# Patient Record
Sex: Female | Born: 2000 | Race: White | Hispanic: No | Marital: Single | Smoking: Former smoker
Health system: Southern US, Community
[De-identification: ages and names within clinical notes are randomized; demographics above are authoritative.]

## PROBLEM LIST (undated history)

## (undated) DIAGNOSIS — F909 Attention-deficit hyperactivity disorder, unspecified type: Secondary | ICD-10-CM

## (undated) DIAGNOSIS — F419 Anxiety disorder, unspecified: Secondary | ICD-10-CM

## (undated) DIAGNOSIS — M419 Scoliosis, unspecified: Secondary | ICD-10-CM

## (undated) DIAGNOSIS — F32A Depression, unspecified: Secondary | ICD-10-CM

## (undated) HISTORY — DX: Attention-deficit hyperactivity disorder, unspecified type: F90.9

## (undated) HISTORY — DX: Depression, unspecified: F32.A

## (undated) HISTORY — DX: Scoliosis, unspecified: M41.9

## (undated) HISTORY — PX: NO PAST SURGERIES: SHX2092

## (undated) HISTORY — DX: Anxiety disorder, unspecified: F41.9

---

## 2021-08-29 ENCOUNTER — Encounter: Payer: Self-pay | Admitting: *Deleted

## 2021-08-29 ENCOUNTER — Other Ambulatory Visit: Payer: Self-pay

## 2021-08-29 ENCOUNTER — Emergency Department
Admission: EM | Admit: 2021-08-29 | Discharge: 2021-08-29 | Disposition: A | Discharge status: Home / Self Care | Payer: 59 | Attending: Emergency Medicine | Admitting: Emergency Medicine

## 2021-08-29 DIAGNOSIS — U071 COVID-19: Secondary | ICD-10-CM | POA: Diagnosis not present

## 2021-08-29 DIAGNOSIS — J3489 Other specified disorders of nose and nasal sinuses: Secondary | ICD-10-CM | POA: Diagnosis not present

## 2021-08-29 DIAGNOSIS — F17210 Nicotine dependence, cigarettes, uncomplicated: Secondary | ICD-10-CM | POA: Diagnosis not present

## 2021-08-29 DIAGNOSIS — R112 Nausea with vomiting, unspecified: Secondary | ICD-10-CM | POA: Diagnosis not present

## 2021-08-29 DIAGNOSIS — R110 Nausea: Secondary | ICD-10-CM

## 2021-08-29 DIAGNOSIS — R0981 Nasal congestion: Secondary | ICD-10-CM | POA: Insufficient documentation

## 2021-08-29 MED ORDER — DICYCLOMINE HCL 10 MG PO CAPS: 10.0000 mg | ORAL_CAPSULE | Freq: Three times a day (TID) | ORAL | 0 refills | Status: DC

## 2021-08-29 MED ORDER — ONDANSETRON 4 MG PO TBDP: 4.0000 mg | ORAL_TABLET | Freq: Three times a day (TID) | ORAL | 0 refills | Status: AC | PRN

## 2021-08-29 MED ORDER — ONDANSETRON 4 MG PO TBDP
4.0000 mg | ORAL_TABLET | Freq: Once | ORAL | Status: AC
  Administered 2021-08-29: 4 mg via ORAL
  Filled 2021-08-29: qty 1

## 2021-08-29 NOTE — Discharge Instructions (Addendum)
You can take Zofran up to 3 times daily for the next 5 days. You can take Bentyl for abdominal spasms up to 3 times daily. You can take Zyrtec, and over-the-counter antihistamine once daily for runny nose. You can take 1 spray of Flonase, and over-the-counter medication, for ear pain and nasal congestion.

## 2021-08-29 NOTE — ED Triage Notes (Signed)
Pt tested positive 2 days ago for covid with a home test.  Pt has nausea and vomiting, bodyaches, chills fever .  Pt alert  speech clear.

## 2021-08-29 NOTE — ED Provider Notes (Signed)
[facility]-EMERGENCY DEPARTMENT  ____________________________________________  Time seen: Approximately 6:53 PM  I have reviewed the triage vital signs and the nursing notes.   HISTORY  Chief Complaint Covid Exposure and Nausea   Historian Patient    HPI Cynthia Bush is a 21 y.o. female presents to the emergency department with rhinorrhea, nasal congestion, nonproductive cough, nausea, vomiting and diarrhea.  She tested positive for COVID-19 2 days ago and is requesting antiemetics.  She denies chest pain, chest tightness or abdominal pain.  Patient attends [employer] has numerous potential sick contacts.   No past medical history on file.   Immunizations up to date:  Yes.     No past medical history on file.  There are no problems to display for this patient.     Prior to Admission medications   Medication Sig Start Date End Date Taking? Authorizing Provider  dicyclomine (BENTYL) 10 MG capsule Take 1 capsule (10 mg total) by mouth 4 (four) times daily -  before meals and at bedtime for 3 days. 08/29/21 09/01/21 Yes Pia Mau M, PA-C  escitalopram (LEXAPRO) 10 MG tablet Take 10 mg by mouth daily.   Yes [provider]  ondansetron (ZOFRAN ODT) 4 MG disintegrating tablet Take 1 tablet (4 mg total) by mouth every 8 (eight) hours as needed for up to 5 days. 08/29/21 09/03/21 Yes Orvil Feil, PA-C    Allergies Patient has no known allergies.  No family history on file.  Social History Social History   Tobacco Use   Smoking status: Some Days    Types: Cigarettes   Smokeless tobacco: Never  Vaping Use   Vaping Use: Never used  Substance Use Topics   Alcohol use: Yes     Review of Systems  Constitutional: Patient has fever.  Eyes: No visual changes. No discharge ENT: Patient has congestion.  Cardiovascular: no chest pain. Respiratory: Patient has cough.  Gastrointestinal: No abdominal pain.  No nausea, no vomiting. Patient had diarrhea.   Genitourinary: Negative for dysuria. No hematuria Musculoskeletal: Patient has myalgias.  Skin: Negative for rash, abrasions, lacerations, ecchymosis. Neurological: Patient has headache, no focal weakness or numbness.     ____________________________________________   PHYSICAL EXAM:  VITAL SIGNS: ED Triage Vitals  Enc Vitals Group     BP 08/29/21 1635 132/87     Pulse Rate 08/29/21 1635 77     Resp 08/29/21 1635 18     Temp 08/29/21 1635 99.4 F (37.4 C)     Temp Source 08/29/21 1635 Oral     SpO2 08/29/21 1635 96 %     Weight 08/29/21 1632 145 lb (65.8 kg)     Height 08/29/21 1632 5\' 7"  (1.702 m)     Head Circumference --      Peak Flow --      Pain Score 08/29/21 1632 5     Pain Loc --      Pain Edu? --      Excl. in GC? --     Constitutional: Alert and oriented. Patient is lying supine. Eyes: Conjunctivae are normal. PERRL. EOMI. Head: Atraumatic. ENT:      Ears: Tympanic membranes are mildly injected with mild effusion bilaterally.       Nose: No congestion/rhinnorhea.      Mouth/Throat: Mucous membranes are moist. Posterior pharynx is mildly erythematous.  Hematological/Lymphatic/Immunilogical: No cervical lymphadenopathy.  Cardiovascular: Normal rate, regular rhythm. Normal S1 and S2.  Good peripheral circulation. Respiratory: Normal respiratory effort without tachypnea or retractions. Lungs  CTAB. Good air entry to the bases with no decreased or absent breath sounds. Gastrointestinal: Bowel sounds 4 quadrants. Soft and nontender to palpation. No guarding or rigidity. No palpable masses. No distention. No CVA tenderness. Musculoskeletal: Full range of motion to all extremities. No gross deformities appreciated. Neurologic:  Normal speech and language. No gross focal neurologic deficits are appreciated.  Skin:  Skin is warm, dry and intact. No rash noted. Psychiatric: Mood and affect are normal. Speech and behavior are normal. Patient exhibits appropriate insight  and judgement.'   ____________________________________________   LABS (all labs ordered are listed, but only abnormal results are displayed)  Labs Reviewed - No data to display ____________________________________________  EKG   ____________________________________________  RADIOLOGY   No results found.  ____________________________________________    PROCEDURES  Procedure(s) performed:     Procedures     Medications  ondansetron (ZOFRAN-ODT) disintegrating tablet 4 mg (4 mg Oral Given 08/29/21 1741)     ____________________________________________   INITIAL IMPRESSION / ASSESSMENT AND PLAN / ED COURSE  Pertinent labs & imaging results that were available during my care of the patient were reviewed by me and considered in my medical decision making (see chart for details).      Assessment and Plan:  COVID-[age] year old female presents to the emergency department with viral URI-like symptoms.  Patient was given a prescription for Zofran and Bentyl and advised to follow-up with her primary care provider as needed.      ____________________________________________  FINAL CLINICAL IMPRESSION(S) / ED DIAGNOSES  Final diagnoses:  Nausea      NEW MEDICATIONS STARTED DURING THIS VISIT:  ED Discharge Orders          Ordered    ondansetron (ZOFRAN ODT) 4 MG disintegrating tablet  Every 8 hours PRN        08/29/21 1720    dicyclomine (BENTYL) 10 MG capsule  3 times daily before meals & bedtime        08/29/21 1720                This chart was dictated using voice recognition software/Dragon. Despite best efforts to proofread, errors can occur which can change the meaning. Any change was purely unintentional.     Orvil Feil, PA-C 08/29/21 Heather Roberts, MD 09/01/21 [phone]

## 2022-03-04 ENCOUNTER — Emergency Department
Admission: EM | Admit: 2022-03-04 | Discharge: 2022-03-04 | Disposition: A | Discharge status: Home / Self Care | Payer: 59 | Attending: Emergency Medicine | Admitting: Emergency Medicine

## 2022-03-04 ENCOUNTER — Other Ambulatory Visit: Payer: Self-pay

## 2022-03-04 ENCOUNTER — Emergency Department: Payer: 59

## 2022-03-04 DIAGNOSIS — R109 Unspecified abdominal pain: Secondary | ICD-10-CM | POA: Diagnosis present

## 2022-03-04 DIAGNOSIS — M545 Low back pain, unspecified: Secondary | ICD-10-CM | POA: Insufficient documentation

## 2022-03-04 DIAGNOSIS — R3129 Other microscopic hematuria: Secondary | ICD-10-CM | POA: Insufficient documentation

## 2022-03-04 LAB — URINALYSIS, ROUTINE W REFLEX MICROSCOPIC
Bilirubin Urine: NEGATIVE
Glucose, UA: NEGATIVE mg/dL
Ketones, ur: NEGATIVE mg/dL
Leukocytes,Ua: NEGATIVE
Nitrite: NEGATIVE
Protein, ur: NEGATIVE mg/dL
Specific Gravity, Urine: 1.024 (ref 1.005–1.030)
pH: 5 (ref 5.0–8.0)

## 2022-03-04 LAB — POC URINE PREG, ED: Preg Test, Ur: NEGATIVE

## 2022-03-04 MED ORDER — METHOCARBAMOL 500 MG PO TABS: 500.0000 mg | ORAL_TABLET | Freq: Four times a day (QID) | ORAL | 0 refills | Status: DC

## 2022-03-04 MED ORDER — MELOXICAM 15 MG PO TABS: 15.0000 mg | ORAL_TABLET | Freq: Every day | ORAL | 0 refills | Status: AC

## 2022-03-04 NOTE — ED Provider Notes (Signed)
? ?[facility] ?Provider Note ? ? ? Event Date/Time  ? First MD Initiated Contact with Patient 03/04/22 1548   ?  (approximate) ? ? ?History  ? ?Back Pain ? ? ?HPI ? ?Cynthia Bush is a 22 y.o. female with history of scoliosis and as listed in EMR presents to the emergency department for treatment and evaluation of lower back pain that has been ongoing for about 2 weeks.  This is different from her typical scoliosis type pain.  Pain has gotten worse and its mainly in the left flank.  No dysuria.  No alleviating measures attempted prior to arrival. ? ?  ? ? ?Physical Exam  ? ?Triage Vital Signs: ?ED Triage Vitals  ?Enc Vitals Group  ?   BP 03/04/22 1345 122/77  ?   Pulse Rate 03/04/22 1345 72  ?   Resp 03/04/22 1345 18  ?   Temp 03/04/22 1345 98 ?F (36.7 ?C)  ?   Temp Source 03/04/22 1345 Oral  ?   SpO2 03/04/22 1345 100 %  ?   Weight 03/04/22 1341 145 lb (65.8 kg)  ?   Height 03/04/22 1341 5\' 7"  (1.702 m)  ?   Head Circumference --   ?   Peak Flow --   ?   Pain Score 03/04/22 1341 7  ?   Pain Loc --   ?   Pain Edu? --   ?   Excl. in GC? --   ? ? ?Most recent vital signs: ?Vitals:  ? 03/04/22 1345  ?BP: 122/77  ?Pulse: 72  ?Resp: 18  ?Temp: 98 ?F (36.7 ?C)  ?SpO2: 100%  ? ? ?General: Awake, no distress.  ?CV:  Good peripheral perfusion.  ?Resp:  Normal effort.  ?Abd:  No distention.  ?Other:  No CVA tenderness.  No focal midline tenderness of the lumbar spine. ? ? ?ED Results / Procedures / Treatments  ? ?Labs ?(all labs ordered are listed, but only abnormal results are displayed) ?Labs Reviewed  ?URINALYSIS, ROUTINE W REFLEX MICROSCOPIC - Abnormal; Notable for the following components:  ?    Result Value  ? Color, Urine YELLOW (*)   ? APPearance HAZY (*)   ? Hgb urine dipstick MODERATE (*)   ? Bacteria, UA RARE (*)   ? All other components within normal limits  ?POC URINE PREG, ED  ? ? ? ?EKG ? ? ? ? ?RADIOLOGY ? ?Image and radiology report reviewed by me. ? ?CT for renal stone study was  negative for any acute concerns. ? ?PROCEDURES: ? ?Critical Care performed: No ? ?Procedures ? ? ?MEDICATIONS ORDERED IN ED: ?Medications - No data to display ? ? ?IMPRESSION / MDM / ASSESSMENT AND PLAN / ED COURSE  ? ?I have reviewed the triage note. ? ?Differential diagnosis includes, but is not limited to, acute on chronic back pain, nephrolithiasis, ureterolithiasis, pyelonephritis, acute cystitis ? ?22 year old female presenting to the emergency department for treatment and evaluation of left flank pain.  See HPI for further details.  Urinalysis does indicate that there is some microscopic hematuria.  CT renal stone study obtained and is negative for kidney stone.  Urinalysis does not indicate cystitis.  Symptoms most likely musculoskeletal and she will be treated with Robaxin and meloxicam.  She is to follow-up with primary care or return to the emergency department if symptoms change or worsen. ? ?  ? ? ?FINAL CLINICAL IMPRESSION(S) / ED DIAGNOSES  ? ?Final diagnoses:  ?Acute flank pain  ? ? ? ?  Rx / DC Orders  ? ?ED Discharge Orders   ? ?      Ordered  ?  methocarbamol (ROBAXIN) 500 MG tablet  4 times daily       ? 03/04/22 1654  ?  meloxicam (MOBIC) 15 MG tablet  Daily       ? 03/04/22 1654  ? ?  ?  ? ?  ? ? ? ?Note:  This document was prepared using Dragon voice recognition software and may include unintentional dictation errors. ?  Chinita Pester, FNP ?03/05/22 1549 ? ?  ?Sharyn Creamer, MD ?03/12/22 2351 ? ?

## 2022-03-04 NOTE — ED Notes (Signed)
See triage note  presents with lower back pain  states pain started couple of days ago  denies any recent injury  ambulates well  no urinary sxs' ?

## 2022-03-04 NOTE — ED Triage Notes (Signed)
Pt comes pov with lower back pain for about 2 weeks. States she has scoliolis and sometimes it flairs up.  ?

## 2022-03-21 ENCOUNTER — Ambulatory Visit (INDEPENDENT_AMBULATORY_CARE_PROVIDER_SITE_OTHER): Payer: 59 | Admitting: Obstetrics and Gynecology

## 2022-03-21 ENCOUNTER — Encounter: Payer: Self-pay | Admitting: Obstetrics and Gynecology

## 2022-03-21 ENCOUNTER — Other Ambulatory Visit (HOSPITAL_COMMUNITY)
Admission: RE | Admit: 2022-03-21 | Discharge: 2022-03-21 | Disposition: A | Discharge status: Home / Self Care | Payer: 59 | Source: Ambulatory Visit | Attending: Obstetrics and Gynecology | Admitting: Obstetrics and Gynecology

## 2022-03-21 ENCOUNTER — Other Ambulatory Visit: Payer: Self-pay

## 2022-03-21 VITALS — BP 110/70 | Ht 67.0 in | Wt 145.0 lb

## 2022-03-21 DIAGNOSIS — M545 Low back pain, unspecified: Secondary | ICD-10-CM

## 2022-03-21 DIAGNOSIS — M4150 Other secondary scoliosis, site unspecified: Secondary | ICD-10-CM

## 2022-03-21 DIAGNOSIS — Z113 Encounter for screening for infections with a predominantly sexual mode of transmission: Secondary | ICD-10-CM | POA: Insufficient documentation

## 2022-03-21 DIAGNOSIS — M419 Scoliosis, unspecified: Secondary | ICD-10-CM | POA: Insufficient documentation

## 2022-03-21 DIAGNOSIS — Z124 Encounter for screening for malignant neoplasm of cervix: Secondary | ICD-10-CM | POA: Insufficient documentation

## 2022-03-21 DIAGNOSIS — N946 Dysmenorrhea, unspecified: Secondary | ICD-10-CM

## 2022-03-21 DIAGNOSIS — Z3041 Encounter for surveillance of contraceptive pills: Secondary | ICD-10-CM | POA: Diagnosis not present

## 2022-03-21 DIAGNOSIS — G8929 Other chronic pain: Secondary | ICD-10-CM

## 2022-03-21 MED ORDER — NORGESTIMATE-ETH ESTRADIOL 0.25-35 MG-MCG PO TABS: 1.0000 | ORAL_TABLET | Freq: Every day | ORAL | 4 refills | Status: AC

## 2022-03-21 NOTE — Patient Instructions (Signed)
I value your feedback and you entrusting us with your care. If you get a [facility] patient survey, I would appreciate you taking the time to let us know about your experience today. Thank you! ? ? ?

## 2022-03-21 NOTE — Progress Notes (Signed)
? ? ?[facility], [facility] ? ? ?Chief Complaint  ?Patient presents with  ? Dysmenorrhea  ?  X 2-3 years  ? Pap only  ? ? ?HPI: ?     [name] is a 22 y.o. No obstetric history on file. whose LMP was Patient's last menstrual period was 03/13/2022 (approximate)., presents today for NP eval of dysmenorrhea, referred by PCP. Menses are monthly on OCPs, lasting 6 days, mod flow, no BTB. Pt with severe pelvic pain and low back pain with menses for a few yrs, not improved with NSAIDs/heating pad. Sometimes misses school/activities due to pain. Hx of scoliosis and LBP in general, has been getting worse recently. Pain with menses worse since worsening scoliosis pain. Now doing PT/stretching with some relief. Went to ED 3/23 for LT flank pain. No relief with meloxicam/flexeril. Ice helps the most. Had neg CT renal study with neg uterus/ovaries.  ?Pap smear due, no hx of STDs. Pt is sex active, using pills/condoms sometimes. No pain/bleeding with sex.  ?Gardasil completed.  ? ? ?Past Medical History:  ?Diagnosis Date  ? ADHD   ? Anxiety   ? Depression   ? Scoliosis   ? ? ?Past Surgical History:  ?Procedure Laterality Date  ? NO PAST SURGERIES    ? ? ?Family History  ?Problem Relation Age of Onset  ? Breast cancer Maternal Grandmother 6  ? Cancer - Colon Maternal Grandfather 16  ? ? ?Social History  ? ?Socioeconomic History  ? Marital status: Single  ?  Spouse name: Not on file  ? Number of children: Not on file  ? Years of education: Not on file  ? Highest education level: Not on file  ?Occupational History  ? Not on file  ?Tobacco Use  ? Smoking status: Former  ?  Types: Cigarettes  ? Smokeless tobacco: Never  ?Vaping Use  ? Vaping Use: Never used  ?Substance and Sexual Activity  ? Alcohol use: Yes  ? Drug use: Yes  ?  Types: Marijuana  ? Sexual activity: Yes  ?  Birth control/protection: Pill, Condom  ?Other Topics Concern  ? Not on file  ?Social History Narrative  ? Not on file  ? ?Social Determinants of Health   ? ?Financial Resource Strain: Not on file  ?Food Insecurity: Not on file  ?Transportation Needs: Not on file  ?Physical Activity: Not on file  ?Stress: Not on file  ?Social Connections: Not on file  ?Intimate Partner Violence: Not on file  ? ? ?Outpatient Medications Prior to Visit  ?Medication Sig Dispense Refill  ? cloNIDine (CATAPRES) 0.1 MG tablet Take 0.1-0.2 mg by mouth at bedtime as needed.    ? escitalopram (LEXAPRO) 20 MG tablet Take 20 mg by mouth daily.    ? meloxicam (MOBIC) 15 MG tablet Take 1 tablet (15 mg total) by mouth daily. 30 tablet 0  ? venlafaxine XR (EFFEXOR-XR) 37.5 MG 24 hr capsule Take by mouth.    ? VYVANSE 50 MG capsule Take 50 mg by mouth every morning.    ? ESTARYLLA 0.25-35 MG-MCG tablet Take 1 tablet by mouth daily.    ? dicyclomine (BENTYL) 10 MG capsule Take 1 capsule (10 mg total) by mouth 4 (four) times daily -  before meals and at bedtime for 3 days. 10 capsule 0  ? escitalopram (LEXAPRO) 10 MG tablet Take 10 mg by mouth daily.    ? methocarbamol (ROBAXIN) 500 MG tablet Take 1 tablet (500 mg total) by mouth 4 (four)  times daily. 30 tablet 0  ? ?No facility-administered medications prior to visit.  ? ? ? ? ?ROS: ? ?Review of Systems  ?Constitutional:  Negative for fever.  ?Gastrointestinal:  Negative for blood in stool, constipation, diarrhea, nausea and vomiting.  ?Genitourinary:  Positive for pelvic pain. Negative for dyspareunia, dysuria, flank pain, frequency, hematuria, urgency, vaginal bleeding, vaginal discharge and vaginal pain.  ?Musculoskeletal:  Positive for back pain.  ?Skin:  Negative for rash.  ?BREAST: No symptoms ? ? ?OBJECTIVE:  ? ?Vitals:  ?BP 110/70   Ht 5\' 7"  (1.702 m)   Wt 145 lb (65.8 kg)   LMP 03/13/2022 (Approximate)   BMI 22.71 kg/m?  ? ?Physical Exam ?Vitals reviewed.  ?Constitutional:   ?   Appearance: She is well-developed.  ?Pulmonary:  ?   Effort: Pulmonary effort is normal.  ?Abdominal:  ?   Palpations: Abdomen is soft.  ?   Tenderness: There is  abdominal tenderness in the suprapubic area. There is no guarding or rebound.  ?Genitourinary: ?   General: Normal vulva.  ?   Pubic Area: No rash.   ?   Labia:     ?   Right: No rash, tenderness or lesion.     ?   Left: No rash, tenderness or lesion.   ?   Vagina: Normal. No vaginal discharge, erythema or tenderness.  ?   Cervix: Normal.  ?   Uterus: Normal. Not enlarged and not tender.   ?   Adnexa: Right adnexa normal and left adnexa normal.    ?   Right: No mass or tenderness.      ?   Left: No mass or tenderness.    ?Musculoskeletal:     ?   General: Normal range of motion.  ?   Cervical back: Normal range of motion.  ?Skin: ?   General: Skin is warm and dry.  ?Neurological:  ?   General: No focal deficit present.  ?   Mental Status: She is alert and oriented to person, place, and time.  ?Psychiatric:     ?   Mood and Affect: Mood normal.     ?   Behavior: Behavior normal.     ?   Thought Content: Thought content normal.     ?   Judgment: Judgment normal.  ? ? ? ?Assessment/Plan: ?Dysmenorrhea - Plan: norgestimate-ethinyl estradiol (ESTARYLLA) 0.25-35 MG-MCG tablet; sx more than likely related to exacerbated scoliosis pain with menses. Pt with neg CT 2/23 so GYN u/s not indicated at this time. Discussed dx lap for endometriosis if sx persist. Discussed trying to prevent cycles for now with cont dosing OCPs vs depo. Will do cont dosing OCPs, Rx eRxd. Pt to f/u with pain. If sx persist, will try depo or dx lap ref. Pt graduating 5/23 from [place] and moving away. ? ?Chronic bilateral low back pain without sciatica--cont exercise/PT. Can schedule appt at [facility] for further eval.  ? ?Other secondary scoliosis, unspecified spinal region ? ?Encounter for surveillance of contraceptive pills - Plan: norgestimate-ethinyl estradiol (ESTARYLLA) 0.25-35 MG-MCG tablet; Rx RF ? ?Cervical cancer screening - Plan: Cytology - PAP ? ?Screening for STD (sexually transmitted disease) - Plan: Cytology - PAP ? ? ? ?Meds ordered  this encounter  ?Medications  ? norgestimate-ethinyl estradiol (ESTARYLLA) 0.25-35 MG-MCG tablet  ?  Sig: Take 1 tablet by mouth daily. CONTINUOUS DOSING  ?  Dispense:  84 tablet  ?  Refill:  4  ?  Order Specific Question:  Supervising Provider  ?  AnswerNadara Mustard:   HARRIS, ROBERT P [[id number]]  ? ? ? ? Return if symptoms worsen or fail to improve. ? ?Tryone Kille B. Shunte Senseney, PA-C ?03/21/2022 ?2:31 PM ? ? ? ? ? ?

## 2022-03-23 LAB — CYTOLOGY - PAP
Chlamydia: NEGATIVE
Comment: NEGATIVE
Comment: NORMAL
Diagnosis: NEGATIVE
Neisseria Gonorrhea: NEGATIVE

## 2022-04-03 ENCOUNTER — Telehealth: Payer: Self-pay

## 2022-04-03 NOTE — Telephone Encounter (Signed)
Pt calling; painful periods; scoliosis back pain; is taking bcp continuous; is bleeding c clots; has freq urination, and vaginal swelling, is curious about endometriosis.  What are next steps.  [phone] Called pt to see if saturating a pad q24min-1hr; pt states she probably is; adv per policy to go to ER; is allowed someone to be with her. ?

## 2022-05-29 ENCOUNTER — Other Ambulatory Visit: Payer: Self-pay

## 2022-05-30 LAB — LITHIUM LEVEL: Lithium Lvl: 0.8 mmol/L (ref 0.5–1.2)

## 2022-05-30 LAB — VITAMIN B12: Vitamin B-12: 187 pg/mL — ABNORMAL LOW (ref 232–1245)

## 2023-01-24 IMAGING — CT CT RENAL STONE PROTOCOL
3 of 4 series · 8 of 46 positions shown, 15 images · non-contrast
Comparison: None.

CLINICAL DATA: Flank pain, kidney stone suspected



[Series 4: lung bases · axial · 0.79mm/px · z∈[-685,-625]mm · 4 of 22 slices shown, 9 images]
[im 5/22  soft-tissue]
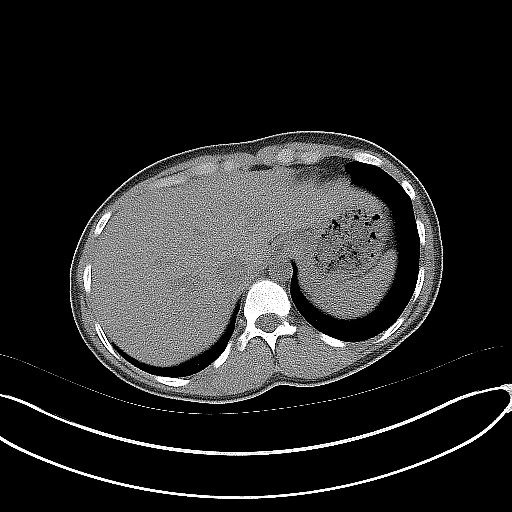
[im 5/22  lung]
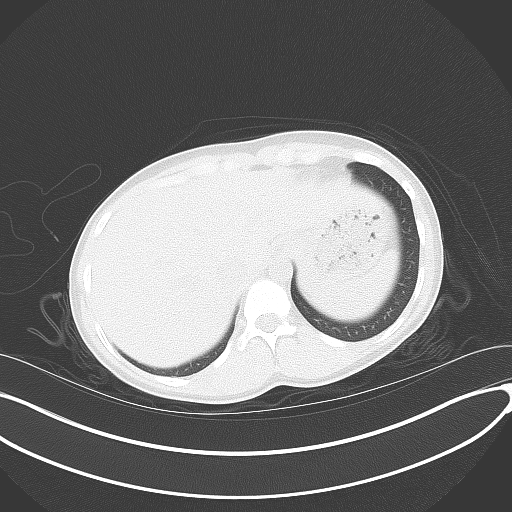
[im 5/22  bone]
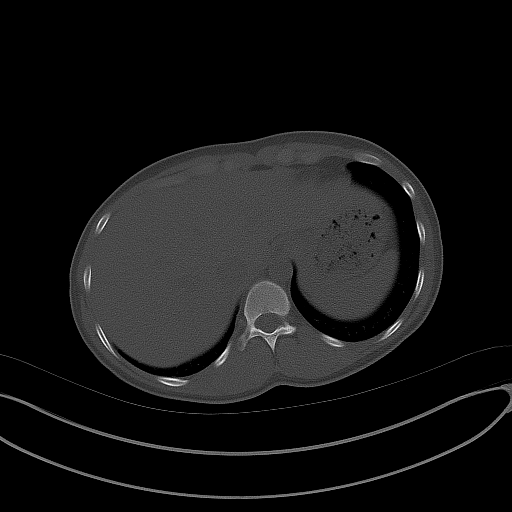
[im 9/22  soft-tissue]
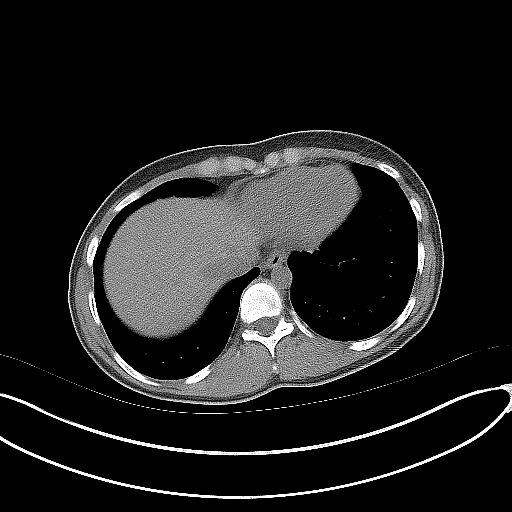
[im 9/22  lung]
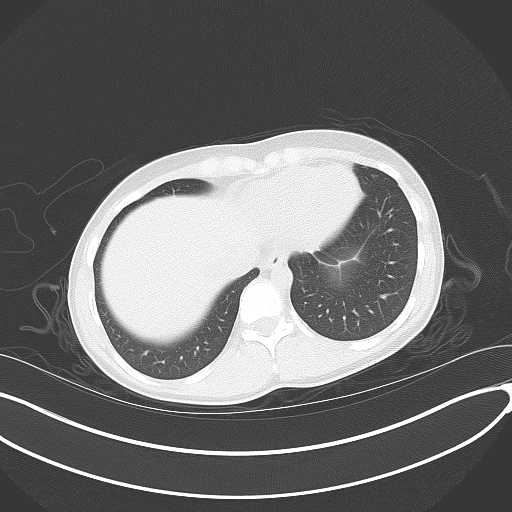
[im 13/22  soft-tissue]
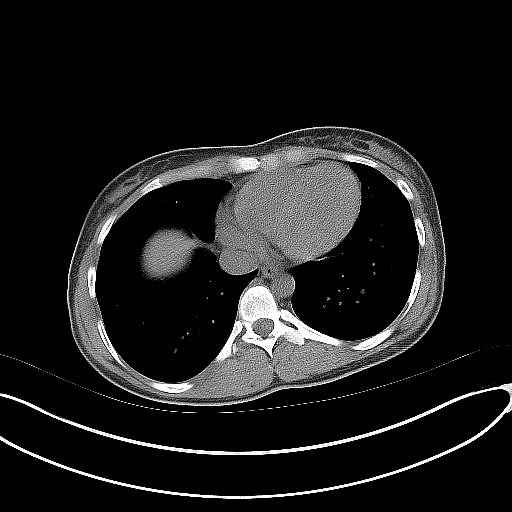
[im 13/22  lung]
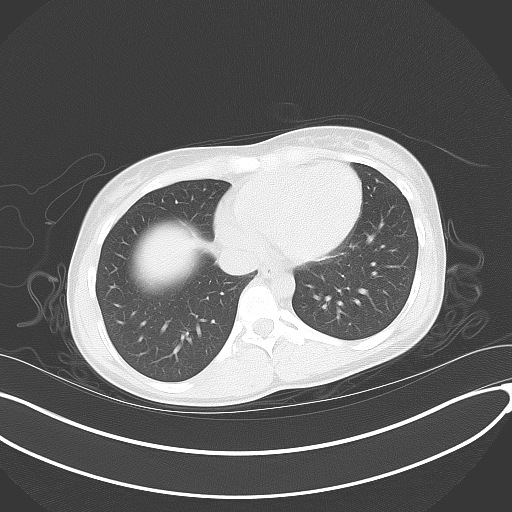
[im 17/22  soft-tissue]
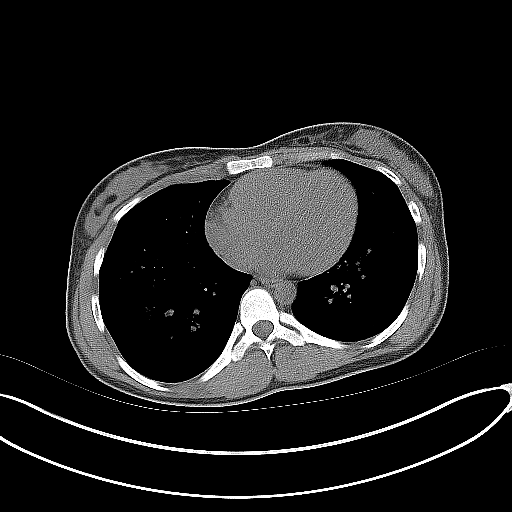
[im 17/22  lung]
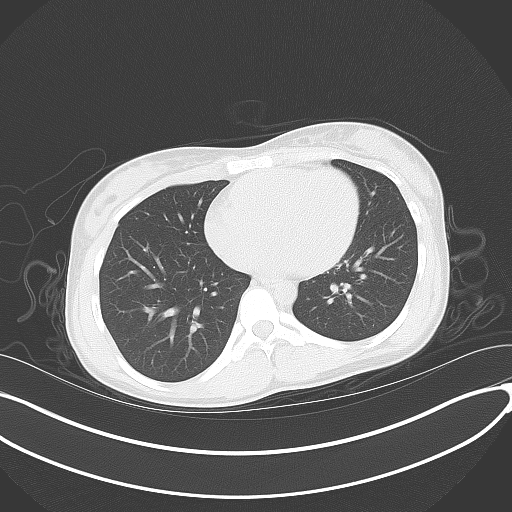

[Series 5: coronal · coronal · 0.67mm/px · 3 of 109 slices shown, 4 images]
[im 37/109  soft-tissue]
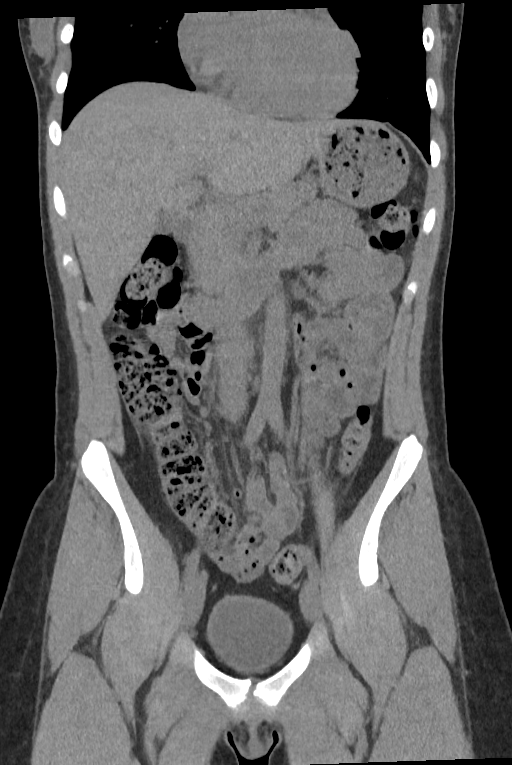
[im 49/109  soft-tissue]
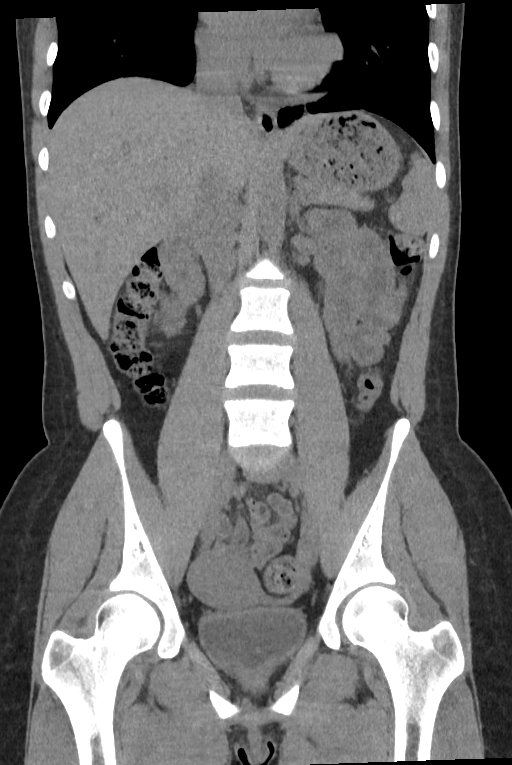
[im 49/109  bone]
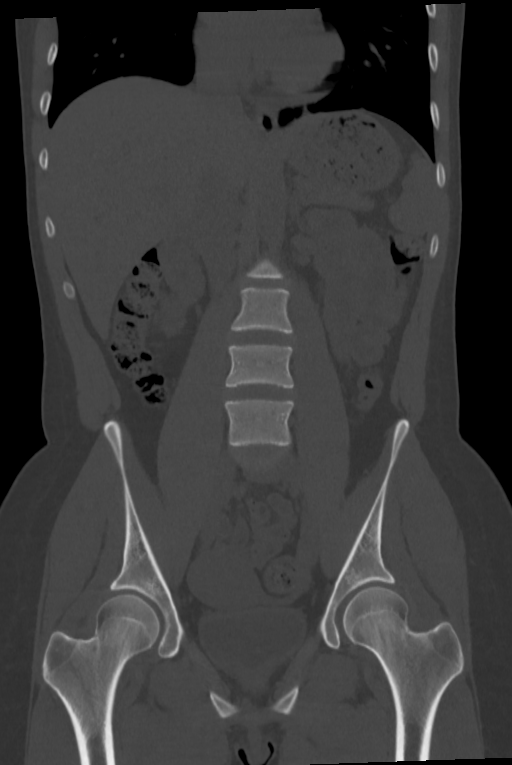
[im 61/109  soft-tissue]
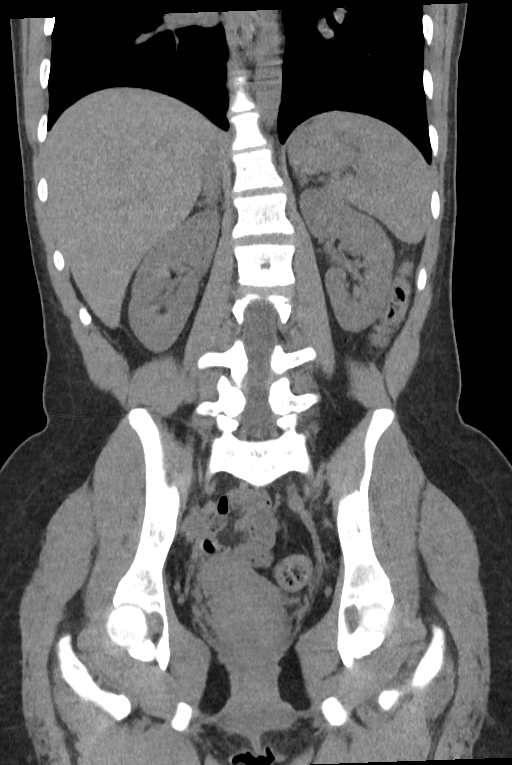

[Series 6: sagittal · sagittal · 0.46mm/px · 1 of 164 slices shown, 2 images]
[im 55/164  soft-tissue]
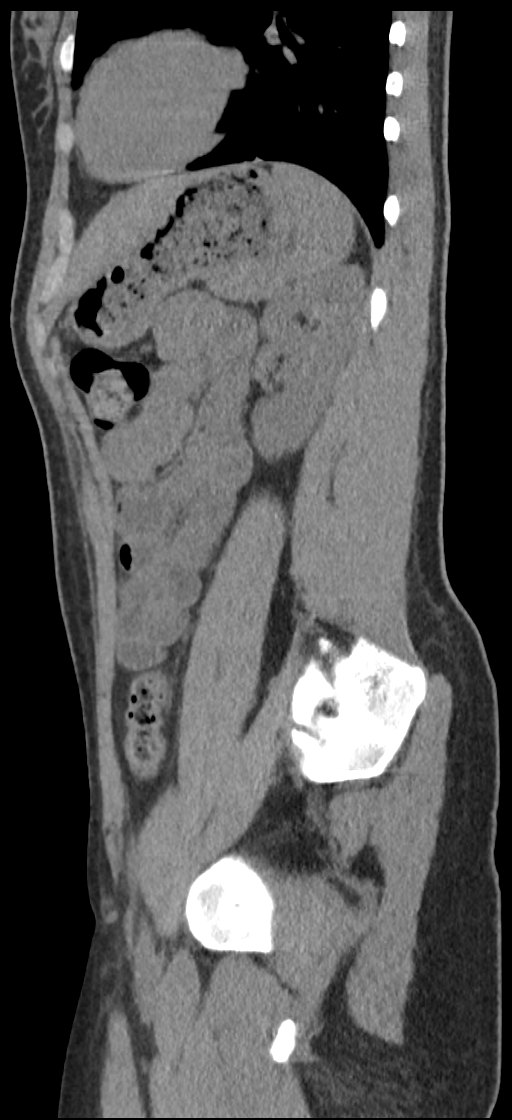
[im 55/164  bone]
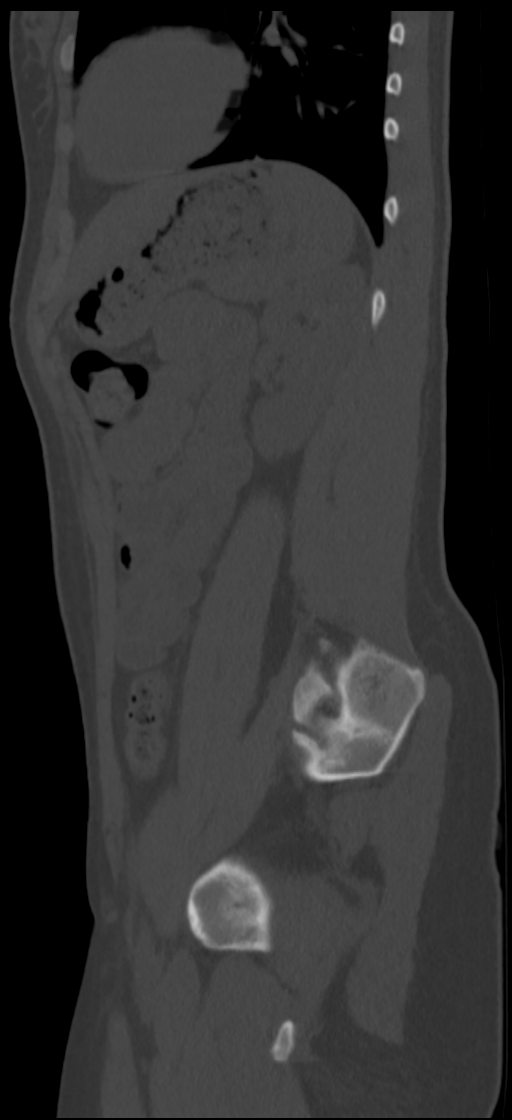

[8 of 46 positions shown; findings below may reference images not displayed]

FINDINGS: Evaluation is limited by lack of IV contrast.

Lower chest: No acute abnormality.

Hepatobiliary: Unremarkable noncontrast appearance of the liver and
gallbladder. No extrahepatic biliary ductal dilation.

Pancreas: No peripancreatic fat stranding.

Spleen: Unremarkable.

Adrenals/Urinary Tract: Adrenal glands are unremarkable. No
hydronephrosis. No obstructing nephrolithiasis. Bladder is
unremarkable.

Stomach/Bowel: Stomach is within normal limits. Appendix appears
normal. No evidence of bowel wall thickening, distention, or
inflammatory changes. Moderate colonic stool burden diffusely
throughout the colon.

Vascular/Lymphatic: No significant vascular findings are present. No
enlarged abdominal or pelvic lymph nodes.

Reproductive: Uterus and bilateral adnexa are unremarkable.

Other: Small volume free fluid in the pelvis, physiologic.

Musculoskeletal: No acute or significant osseous findings.
IMPRESSION: No CT etiology for acute flank pain identified.
